# Patient Record
Sex: Female | Born: 1990 | Race: White | Hispanic: No | Marital: Married | Smoking: Never smoker
Health system: Southern US, Community
[De-identification: ages and names within clinical notes are randomized; demographics above are authoritative.]

## PROBLEM LIST (undated history)

## (undated) HISTORY — PX: TUBAL LIGATION: SHX77

---

## 2020-10-08 ENCOUNTER — Other Ambulatory Visit: Payer: Self-pay

## 2020-10-08 ENCOUNTER — Ambulatory Visit (INDEPENDENT_AMBULATORY_CARE_PROVIDER_SITE_OTHER): Payer: Managed Care, Other (non HMO)

## 2020-10-08 ENCOUNTER — Ambulatory Visit
Admission: EM | Admit: 2020-10-08 | Discharge: 2020-10-08 | Disposition: A | Discharge status: Home / Self Care | Payer: Managed Care, Other (non HMO) | Attending: Urgent Care | Admitting: Urgent Care

## 2020-10-08 DIAGNOSIS — R030 Elevated blood-pressure reading, without diagnosis of hypertension: Secondary | ICD-10-CM

## 2020-10-08 DIAGNOSIS — R079 Chest pain, unspecified: Secondary | ICD-10-CM

## 2020-10-08 DIAGNOSIS — Z833 Family history of diabetes mellitus: Secondary | ICD-10-CM

## 2020-10-08 DIAGNOSIS — J189 Pneumonia, unspecified organism: Secondary | ICD-10-CM | POA: Diagnosis not present

## 2020-10-08 DIAGNOSIS — R682 Dry mouth, unspecified: Secondary | ICD-10-CM

## 2020-10-08 DIAGNOSIS — R0789 Other chest pain: Secondary | ICD-10-CM

## 2020-10-08 LAB — POCT FASTING CBG KUC MANUAL ENTRY: POCT Glucose (KUC): 137 mg/dL — AB (ref 70–99)

## 2020-10-08 MED ORDER — CEFDINIR 300 MG PO CAPS: 300.0000 mg | ORAL_CAPSULE | Freq: Two times a day (BID) | ORAL | 0 refills | Status: DC

## 2020-10-08 MED ORDER — AZITHROMYCIN 250 MG PO TABS: ORAL_TABLET | ORAL | 0 refills | Status: DC

## 2020-10-08 NOTE — ED Triage Notes (Signed)
Pt c/o center to left sided chest pain since last Friday. States worse in the mornings and evenings. Denies SOB or diaphoresis.

## 2020-10-08 NOTE — ED Provider Notes (Signed)
[facility]   MRN: [id number] DOB: Mar 18, 1990  Subjective:   Bonnie Scott is a 30 y.o. female presenting for 5-6 day history of acute onset persistent mid to left-sided chest pain that is moderate in nature, fairly constant.  Symptoms started when she was visiting her brother in [place].  She was not doing anything in particular at the time.  Denies fever, headache, runny or stuffy nose, cough, shortness of breath, wheezing, body aches, heart racing, palpitations, diaphoresis, nausea, vomiting, abdominal pain.  No falls, trauma, car accidents.  Patient does not perform strenuous lifting activities, strenuous work activities.  Denies family history of heart conditions.  However, her mom was recently diagnosed with diabetes.  Patient endorsed that she has felt really dry mouth lately.  Denies smoking cigarettes, alcohol use, drug use.  Denies taking chronic medications.  No Known Allergies  History reviewed. No pertinent past medical history.   Past Surgical History:  Procedure Laterality Date   TUBAL LIGATION      Family history of diabetes with her mother.  Social History   Tobacco Use   Smoking status: Never   Smokeless tobacco: Never  Substance Use Topics   Alcohol use: Not Currently   Drug use: Not Currently    ROS   Objective:   Vitals: BP (!) 171/76 (BP Location: Left Arm)   Pulse 96   Temp 98.6 F (37 C) (Oral)   Resp 18   LMP 09/11/2020   SpO2 98%   Physical Exam Constitutional:      General: She is not in acute distress.    Appearance: Normal appearance. She is well-developed. She is not ill-appearing, toxic-appearing or diaphoretic.  HENT:     Head: Normocephalic and atraumatic.     Right Ear: External ear normal.     Left Ear: External ear normal.     Nose: Nose normal.     Mouth/Throat:     Mouth: Mucous membranes are moist.  Eyes:     General: No scleral icterus.       Right eye: No discharge.        Left eye: No discharge.      Extraocular Movements: Extraocular movements intact.     Conjunctiva/sclera: Conjunctivae normal.     Pupils: Pupils are equal, round, and reactive to light.  Cardiovascular:     Rate and Rhythm: Normal rate and regular rhythm.     Pulses: Normal pulses.     Heart sounds: Normal heart sounds. No murmur heard.   No friction rub. No gallop.  Pulmonary:     Effort: Pulmonary effort is normal. No respiratory distress.     Breath sounds: Normal breath sounds. No stridor. No wheezing, rhonchi or rales.  Chest:     Chest wall: No tenderness.  Skin:    General: Skin is warm and dry.     Findings: No rash.  Neurological:     Mental Status: She is alert and oriented to person, place, and time.  Psychiatric:        Mood and Affect: Mood normal.        Behavior: Behavior normal.        Thought Content: Thought content normal.        Judgment: Judgment normal.    ED ECG REPORT   Date: 10/08/2020  EKG Time: 9:05 AM  Rate: 96bpm  Rhythm: normal sinus rhythm,  normal EKG, normal sinus rhythm  Axis: Normal  Intervals:none  ST&T Change: Nonspecific T wave flattening  Narrative Interpretation: Sinus rhythm at 96 bpm with nonspecific T wave flattening.  No previous EKG available for comparison.    DG Chest 2 View  Result Date: 10/08/2020 CLINICAL DATA:  Chest pain. EXAM: CHEST - 2 VIEW COMPARISON:  None. FINDINGS: The cardiac silhouette, mediastinal and hilar contours are normal. Patchy density in the right middle lobe could reflect atelectasis or an early infiltrate. No pleural effusions. No pulmonary lesions. The bony thorax is intact. IMPRESSION: Right middle lobe atelectasis versus early infiltrate. Electronically Signed   By: Rudie Meyer M.D.   On: 10/08/2020 09:25     Assessment and Plan :   PDMP not reviewed this encounter.  1. Pneumonia of right middle lobe due to infectious organism   2. Atypical chest pain   3. Elevated blood pressure reading without diagnosis of hypertension    4. Dry mouth   5. Family history of diabetes mellitus     EKG reassuring, chest x-ray consistent with a right middle lobe early infiltrate and therefore we will manage for immunity acquired pneumonia.  Given her recent travel we will also test for COVID-19.  Recommended cefdinir, azithromycin.  Repeat x-ray in 3 to 4 weeks.  Use supportive care otherwise. Counseled patient on potential for adverse effects with medications prescribed/recommended today, ER and return-to-clinic precautions discussed, patient verbalized understanding.    Wallis Bamberg, [place] 10/08/20 [phone]

## 2020-10-09 LAB — SARS-COV-2, NAA 2 DAY TAT

## 2020-10-09 LAB — NOVEL CORONAVIRUS, NAA: SARS-CoV-2, NAA: NOT DETECTED

## 2021-09-04 IMAGING — DX DG CHEST 2V
2 series · 2 of 2 positions shown · non-contrast
Comparison: None.

CLINICAL DATA: Chest pain.

EXAM:
CHEST - 2 VIEW

[chest pa]
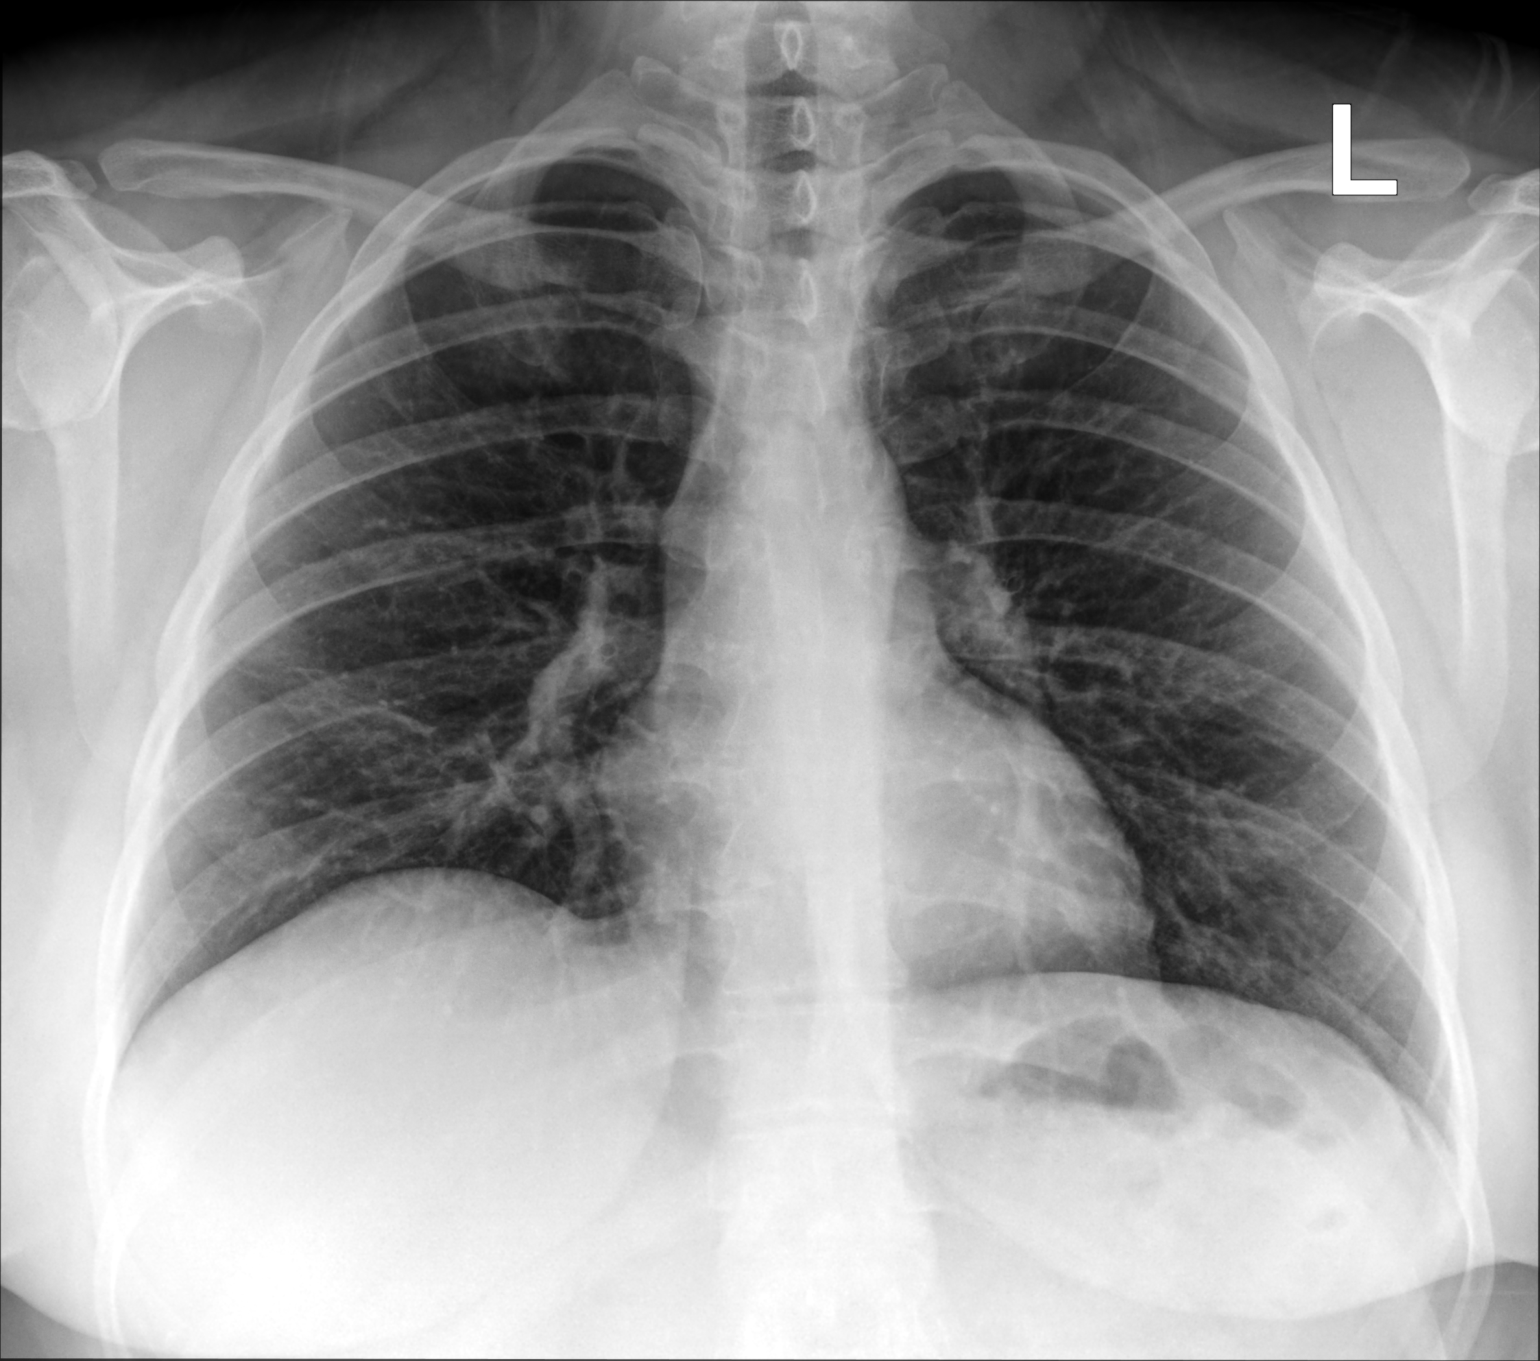

[chest lat]
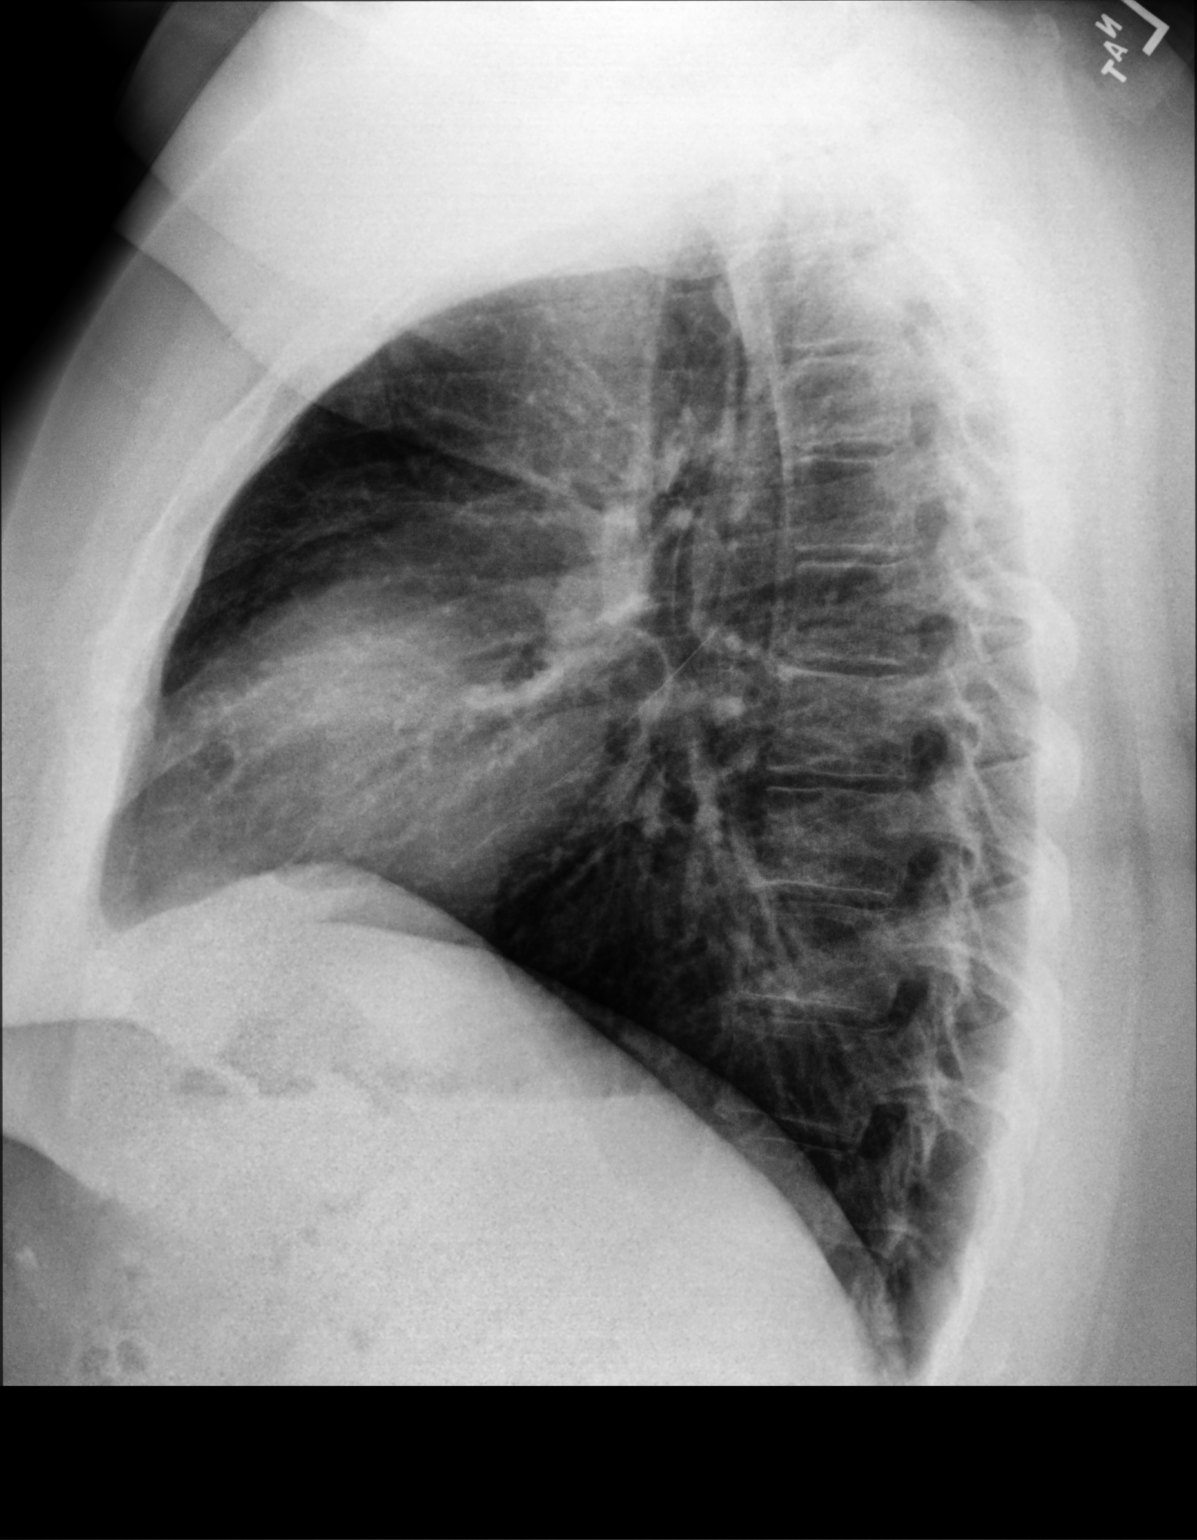

[2 of 2 positions shown; findings below may reference images not displayed]

FINDINGS: The cardiac silhouette, mediastinal and hilar contours are normal.
Patchy density in the right middle lobe could reflect atelectasis or
an early infiltrate. No pleural effusions. No pulmonary lesions. The
bony thorax is intact.
IMPRESSION: Right middle lobe atelectasis versus early infiltrate.

## 2022-12-19 ENCOUNTER — Other Ambulatory Visit: Payer: Self-pay

## 2022-12-19 ENCOUNTER — Ambulatory Visit
Admission: RE | Admit: 2022-12-19 | Discharge: 2022-12-19 | Disposition: A | Discharge status: Home / Self Care | Payer: Managed Care, Other (non HMO) | Source: Ambulatory Visit

## 2022-12-19 VITALS — BP 135/87 | HR 105 | Temp 99.4°F | Resp 20 | Ht 62.0 in | Wt 230.0 lb

## 2022-12-19 DIAGNOSIS — J40 Bronchitis, not specified as acute or chronic: Secondary | ICD-10-CM | POA: Diagnosis not present

## 2022-12-19 DIAGNOSIS — J329 Chronic sinusitis, unspecified: Secondary | ICD-10-CM

## 2022-12-19 MED ORDER — AZITHROMYCIN 250 MG PO TABS: 250.0000 mg | ORAL_TABLET | Freq: Every day | ORAL | 0 refills | Status: DC

## 2022-12-19 MED ORDER — PREDNISONE 20 MG PO TABS: 40.0000 mg | ORAL_TABLET | Freq: Every day | ORAL | 0 refills | Status: AC

## 2022-12-19 MED ORDER — DOXYCYCLINE HYCLATE 100 MG PO CAPS: 100.0000 mg | ORAL_CAPSULE | Freq: Two times a day (BID) | ORAL | 0 refills | Status: AC

## 2022-12-19 NOTE — ED Triage Notes (Signed)
Cough since Sept 20 with subjective fever. Reports productive with white/green sputum and she she also has nasal congestion

## 2022-12-19 NOTE — ED Provider Notes (Signed)
[facility]    CSN: [id number] Arrival date & time: 12/19/22  1530      History   Chief Complaint Chief Complaint  Patient presents with   Cough    Have had a persistent cough since Sep.20. Chest feel heavy and can not take a deep breathe without it hurting. - Entered by patient    HPI Bonnie Scott is a 32 y.o. female.   Pt here today for evaluation of cough that she has had for the last several weeks.  She reports that her chest can feel heavy at times and she feels as if she has some pain when she is trying to take a deep breath.  She notes that symptoms are similar to prior walking pneumonia.  She denies any vomiting or diarrhea.  She questions fever over the last week.  She has taken over-the-counter medication without resolution.  The history is provided by the patient.  Cough Associated symptoms: chills, fever and sore throat   Associated symptoms: no ear pain, no eye discharge, no shortness of breath and no wheezing     History reviewed. No pertinent past medical history.  There are no problems to display for this patient.   Past Surgical History:  Procedure Laterality Date   TUBAL LIGATION      OB History   No obstetric history on file.      Home Medications    Prior to Admission medications   Medication Sig Start Date End Date Taking? Authorizing Provider  azithromycin (ZITHROMAX) 250 MG tablet Take 1 tablet (250 mg total) by mouth daily. Take first 2 tablets together, then 1 every day until finished. 12/19/22  Yes [name], PA-C  doxycycline (VIBRAMYCIN) 100 MG capsule Take 1 capsule (100 mg total) by mouth 2 (two) times daily. 12/19/22  Yes [name], PA-C  predniSONE (DELTASONE) 20 MG tablet Take 2 tablets (40 mg total) by mouth daily with breakfast for 5 days. 12/19/22 12/24/22 Yes [name], PA-C  Prenatal Vit-Fe Fumarate-FA (PNV FOLIC ACID + IRON PO) Take by mouth.    [provider]    Family  History History reviewed. No pertinent family history.  Social History Social History   Tobacco Use   Smoking status: Never   Smokeless tobacco: Never  Vaping Use   Vaping status: Never Used  Substance Use Topics   Alcohol use: Not Currently   Drug use: Not Currently     Allergies   Patient has no known allergies.   Review of Systems Review of Systems  Constitutional:  Positive for chills and fever.  HENT:  Positive for congestion and sore throat. Negative for ear pain.   Eyes:  Negative for discharge and redness.  Respiratory:  Positive for cough. Negative for shortness of breath and wheezing.   Gastrointestinal:  Negative for abdominal pain, diarrhea, nausea and vomiting.     Physical Exam Triage Vital Signs ED Triage Vitals  Encounter Vitals Group     BP 12/19/22 1544 135/87     Systolic BP Percentile --      Diastolic BP Percentile --      Pulse Rate 12/19/22 1544 (!) 105     Resp 12/19/22 1544 20     Temp 12/19/22 1544 99.4 F (37.4 C)     Temp Source 12/19/22 1544 Oral     SpO2 12/19/22 1544 95 %     Weight 12/19/22 1541 230 lb (104.3 kg)     Height  12/19/22 1541 5\' 2"  (1.575 m)     Head Circumference --      Peak Flow --      Pain Score 12/19/22 1540 3     Pain Loc --      Pain Education --      Exclude from Growth Chart --    No data found.  Updated Vital Signs BP 135/87 (BP Location: Right Arm)   Pulse (!) 105   Temp 99.4 F (37.4 C) (Oral)   Resp 20   Ht 5\' 2"  (1.575 m)   Wt 230 lb (104.3 kg)   LMP 10/31/2022 (Approximate)   SpO2 95%   BMI 42.07 kg/m      Physical Exam Vitals and nursing note reviewed.  Constitutional:      General: She is not in acute distress.    Appearance: Normal appearance. She is not ill-appearing.  HENT:     Head: Normocephalic and atraumatic.     Right Ear: Tympanic membrane normal.     Left Ear: Tympanic membrane normal.     Nose: Congestion present.     Mouth/Throat:     Mouth: Mucous membranes are  moist.     Pharynx: No oropharyngeal exudate or posterior oropharyngeal erythema.  Eyes:     Conjunctiva/sclera: Conjunctivae normal.  Cardiovascular:     Rate and Rhythm: Normal rate and regular rhythm.     Heart sounds: Normal heart sounds. No murmur heard. Pulmonary:     Effort: Pulmonary effort is normal. No respiratory distress.     Breath sounds: Normal breath sounds. No wheezing, rhonchi or rales.     Comments: Deep breathing produces cough Skin:    General: Skin is warm and dry.  Neurological:     Mental Status: She is alert.  Psychiatric:        Mood and Affect: Mood normal.        Thought Content: Thought content normal.      UC Treatments / Results  Labs (all labs ordered are listed, but only abnormal results are displayed) Labs Reviewed - No data to display  EKG   Radiology No results found.  Procedures Procedures (including critical care time)  Medications Ordered in UC Medications - No data to display  Initial Impression / Assessment and Plan / UC Course  I have reviewed the triage vital signs and the nursing notes.  Pertinent labs & imaging results that were available during my care of the patient were reviewed by me and considered in my medical decision making (see chart for details).    Will treat to cover suspected sinobronchitis as well as possible atypical pneumonia, community-acquired pneumonia with doxycycline, azithromycin, steroid burst.  Discussed that we did not have x-ray capabilities in office today and offered outpatient x-ray but patient prefers to try treatment first and will return if no improvement over the next 72 hours or sooner with any worsening symptoms.  Final Clinical Impressions(s) / UC Diagnoses   Final diagnoses:  Sinobronchitis   Discharge Instructions   None    ED Prescriptions     Medication Sig Dispense Auth. Provider   doxycycline (VIBRAMYCIN) 100 MG capsule Take 1 capsule (100 mg total) by mouth 2 (two) times  daily. 20 capsule Erma Pinto F, PA-C   azithromycin (ZITHROMAX) 250 MG tablet Take 1 tablet (250 mg total) by mouth daily. Take first 2 tablets together, then 1 every day until finished. 6 tablet Erma Pinto F, PA-C   predniSONE (DELTASONE) 20  MG tablet Take 2 tablets (40 mg total) by mouth daily with breakfast for 5 days. 10 tablet [name], PA-C      PDMP not reviewed this encounter.   [name], PA-C 12/19/22 [phone]

## 2023-01-18 ENCOUNTER — Ambulatory Visit: Payer: Managed Care, Other (non HMO)

## 2023-01-18 ENCOUNTER — Ambulatory Visit
Admission: RE | Admit: 2023-01-18 | Discharge: 2023-01-18 | Disposition: A | Discharge status: Home / Self Care | Payer: Managed Care, Other (non HMO) | Source: Ambulatory Visit | Attending: Physician Assistant | Admitting: Physician Assistant

## 2023-01-18 VITALS — BP 129/76 | HR 94 | Temp 98.5°F | Resp 16 | Ht 62.0 in | Wt 230.0 lb

## 2023-01-18 DIAGNOSIS — R059 Cough, unspecified: Secondary | ICD-10-CM | POA: Diagnosis not present

## 2023-01-18 DIAGNOSIS — J209 Acute bronchitis, unspecified: Secondary | ICD-10-CM

## 2023-01-18 MED ORDER — PREDNISONE 20 MG PO TABS: 40.0000 mg | ORAL_TABLET | Freq: Every day | ORAL | 0 refills | Status: AC

## 2023-01-18 MED ORDER — AZITHROMYCIN 250 MG PO TABS: 250.0000 mg | ORAL_TABLET | Freq: Every day | ORAL | 0 refills | Status: AC

## 2023-01-18 NOTE — ED Triage Notes (Signed)
Patient presents with cough since 9/20, states it was getting better and then started again yesterday. Treated with OTC cold medicine.

## 2023-01-18 NOTE — ED Provider Notes (Signed)
[facility]    CSN: [id number] Arrival date & time: 01/18/23  0850      History   Chief Complaint Chief Complaint  Patient presents with   Wheezing    I've been coughing since 9/20 I did take antibiotics and it did help but did not clear it all the way. - Entered by patient    HPI Bonnie Scott is a 32 y.o. female.   Patient here today for cough she has had for over a month.  She reports cough is improving and then returned.  She has been using over-the-counter medication without resolution.  The history is provided by the patient.  Wheezing Associated symptoms: cough and sore throat   Associated symptoms: no ear pain, no fever and no shortness of breath     History reviewed. No pertinent past medical history.  There are no problems to display for this patient.   Past Surgical History:  Procedure Laterality Date   TUBAL LIGATION      OB History   No obstetric history on file.      Home Medications    Prior to Admission medications   Medication Sig Start Date End Date Taking? Authorizing Provider  azithromycin (ZITHROMAX) 250 MG tablet Take 1 tablet (250 mg total) by mouth daily. Take first 2 tablets together, then 1 every day until finished. 01/18/23  Yes [name], PA-C  doxycycline (VIBRAMYCIN) 100 MG capsule Take 1 capsule (100 mg total) by mouth 2 (two) times daily. 12/19/22   [name], PA-C  Prenatal Vit-Fe Fumarate-FA (PNV FOLIC ACID + IRON PO) Take by mouth.    [provider]    Family History History reviewed. No pertinent family history.  Social History Social History   Tobacco Use   Smoking status: Never   Smokeless tobacco: Never  Vaping Use   Vaping status: Never Used  Substance Use Topics   Alcohol use: Not Currently   Drug use: Not Currently     Allergies   Patient has no known allergies.   Review of Systems Review of Systems  Constitutional:  Negative for chills and fever.  HENT:  Positive  for congestion and sore throat. Negative for ear pain.   Eyes:  Negative for discharge and redness.  Respiratory:  Positive for cough and wheezing. Negative for shortness of breath.   Gastrointestinal:  Negative for abdominal pain, diarrhea, nausea and vomiting.     Physical Exam Triage Vital Signs ED Triage Vitals  Encounter Vitals Group     BP      Systolic BP Percentile      Diastolic BP Percentile      Pulse      Resp      Temp      Temp src      SpO2      Weight      Height      Head Circumference      Peak Flow      Pain Score      Pain Loc      Pain Education      Exclude from Growth Chart    No data found.  Updated Vital Signs BP 129/76 (BP Location: Left Arm)   Pulse 94   Temp 98.5 F (36.9 C) (Oral)   Resp 16   Ht 5\' 2"  (1.575 m)   Wt 230 lb (104.3 kg)   LMP 01/09/2023 (Approximate)   SpO2 99%   BMI 42.07 kg/m  Physical Exam Vitals and nursing note reviewed.  Constitutional:      General: She is not in acute distress.    Appearance: Normal appearance. She is not ill-appearing.  HENT:     Head: Normocephalic and atraumatic.     Right Ear: Tympanic membrane normal.     Left Ear: Tympanic membrane normal.     Nose: Congestion present.     Mouth/Throat:     Mouth: Mucous membranes are moist.     Pharynx: No oropharyngeal exudate or posterior oropharyngeal erythema.  Eyes:     Conjunctiva/sclera: Conjunctivae normal.  Cardiovascular:     Rate and Rhythm: Normal rate and regular rhythm.     Heart sounds: Normal heart sounds. No murmur heard. Pulmonary:     Effort: Pulmonary effort is normal. No respiratory distress.     Breath sounds: No wheezing, rhonchi or rales.  Skin:    General: Skin is warm and dry.  Neurological:     Mental Status: She is alert.  Psychiatric:        Mood and Affect: Mood normal.        Thought Content: Thought content normal.      UC Treatments / Results  Labs (all labs ordered are listed, but only abnormal  results are displayed) Labs Reviewed - No data to display  EKG   Radiology No results found.  Procedures Procedures (including critical care time)  Medications Ordered in UC Medications - No data to display  Initial Impression / Assessment and Plan / UC Course  I have reviewed the triage vital signs and the nursing notes.  Pertinent labs & imaging results that were available during my care of the patient were reviewed by me and considered in my medical decision making (see chart for details).     Will treat to cover bronchitis with steroid and Z-Pak prescribed for atypical pneumonia coverage given duration of symptoms.  Chest x-ray without acute findings.  Recommended follow-up if no gradual improvement or with any further concerns.   Final Clinical Impressions(s) / UC Diagnoses   Final diagnoses:  Acute bronchitis, unspecified organism   Discharge Instructions   None    ED Prescriptions     Medication Sig Dispense Auth. Provider   predniSONE (DELTASONE) 20 MG tablet Take 2 tablets (40 mg total) by mouth daily with breakfast for 5 days. 10 tablet Erma Pinto F, PA-C   azithromycin (ZITHROMAX) 250 MG tablet Take 1 tablet (250 mg total) by mouth daily. Take first 2 tablets together, then 1 every day until finished. 6 tablet [name], PA-C      PDMP not reviewed this encounter.   [name], PA-C 02/08/23 1350
